# Patient Record
Sex: Male | Born: 1985 | Race: Asian | Hispanic: No | Marital: Single | Smoking: Never smoker
Health system: Southern US, Community
[De-identification: ages and names within clinical notes are randomized; demographics above are authoritative.]

## PROBLEM LIST (undated history)

## (undated) HISTORY — PX: SPLENECTOMY: SUR1306

---

## 2006-03-03 ENCOUNTER — Emergency Department (HOSPITAL_COMMUNITY): Admission: EM | Admit: 2006-03-03 | Discharge: 2006-03-04 | Payer: Self-pay | Admitting: Emergency Medicine

## 2013-02-09 ENCOUNTER — Ambulatory Visit (INDEPENDENT_AMBULATORY_CARE_PROVIDER_SITE_OTHER): Payer: BC Managed Care – PPO | Admitting: Family Medicine

## 2013-02-09 VITALS — BP 113/74 | HR 69 | Temp 98.2°F | Resp 16 | Ht 65.0 in | Wt 140.0 lb

## 2013-02-09 DIAGNOSIS — Z9081 Acquired absence of spleen: Secondary | ICD-10-CM

## 2013-02-09 DIAGNOSIS — G44309 Post-traumatic headache, unspecified, not intractable: Secondary | ICD-10-CM

## 2013-02-09 DIAGNOSIS — S0990XS Unspecified injury of head, sequela: Secondary | ICD-10-CM

## 2013-02-09 DIAGNOSIS — Z0000 Encounter for general adult medical examination without abnormal findings: Secondary | ICD-10-CM

## 2013-02-09 DIAGNOSIS — Z9089 Acquired absence of other organs: Secondary | ICD-10-CM

## 2013-02-09 DIAGNOSIS — Z23 Encounter for immunization: Secondary | ICD-10-CM

## 2013-02-09 LAB — POCT CBC
Granulocyte percent: 48.4 %G (ref 37–80)
HCT, POC: 43 % — AB (ref 43.5–53.7)
Hemoglobin: 13.1 g/dL — AB (ref 14.1–18.1)
MCH, POC: 22 pg — AB (ref 27–31.2)
MCV: 72.2 fL — AB (ref 80–97)
MID (cbc): 0.5 (ref 0–0.9)
POC Granulocyte: 2.3 (ref 2–6.9)
POC LYMPH PERCENT: 41.3 %L (ref 10–50)
RBC: 5.95 M/uL (ref 4.69–6.13)
RDW, POC: 15.5 %
WBC: 4.7 10*3/uL (ref 4.6–10.2)

## 2013-02-09 MED ORDER — AMITRIPTYLINE HCL 25 MG PO TABS: ORAL_TABLET | ORAL | Status: AC
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
## 2013-02-09 NOTE — Patient Instructions (Addendum)
Take the amitriptyline 25 mg one at bedtime for headache prevention. If this helps reduce the frequency of headaches, you can continue using it long-term. I gave you refills for 3 or 4 months, and you will need to return at the end of that time for a recheck and more prescriptions.  If the headaches do not get better, and are getting worse at anytime, return for further assessment.  Since you have had your spleen taken out, we are giving you a vaccination for prevention of meningococcal meningitis and of pneumococcal pneumonia. These are 2 types of infections that are removed from the body by the spleen, and 6 you do not have a spleen you should have the vaccinations about every 10 years.  Flu shot every year        Splenectomy, Long-Term Care After A splenectomy is the surgical removal of a diseased or injured spleen. The most common reasons for the spleen to be removed are because of severe injury (trauma), sickle cell disease, or a condition that causes blood clotting problems (idiopathic thrombocytopenic purpura, ITP). The spleen is an organ located in the upper abdomen under your left ribs. It is a sponge-like organ, about the size of an orange, which acts as a filter. The spleen removes waste from the blood, maintains cells that make antibodies to help fight infection, and stores other blood cells. The spleen, along with other body systems and organs, plays an important role in the body's natural defense system (immune system). Once it is removed, there is a slightly greater chance of developing a serious, life-threatening infection (overwhelming postsplenectomy sepsis). It is important to take extra steps to prevent infection. Other precautions may be necessary to prevent blood clots from forming. PREVENTING INFECTION Your caregiver will recommend important steps to help prevent infection. These may include:  Making sure your immunizations are up to date,  including:  Pneumococcus.  Seasonal flu (influenza).  Haemophilus influenzae type b (Hib).  Meningitis.  Making sure family members' vaccines are up to date.  In some cases, antibiotics may be needed if you get symptoms of infection. Not all patients need this type of treatment after a splenectomy. Talk to your caregiver about what is best for you if these symptoms develop.  Following good daily practices to prevent infection, such as:  Washing hands often, especially after preparing food, eating, changing diapers, and playing with children or animals.  Disinfecting surfaces regularly.  Avoiding others with active illness or infections.  Taking precautions to avoid mosquito and tick bites, such as:  Wearing proper clothing that covers the entire body when you are in wooded or marshy areas.  Changing clothing right away and checking for bites after you have been outside.  Using insect spray.  Using insect netting.  Staying indoors during peak mosquito hours. PREVENTING BLOOD CLOTS Splenectomy may increase your risk of forming a blood clot. This is of utmost concern immediately after surgery. However, it may continue as a lifelong risk. To help prevent blood clots, your caregiver may recommend:  Exercising as directed. Find a safe, regular exercise program that works well for you.  Getting up, stretching, and moving around every hour if you sit a lot or do not move about much at work or during travel time.  Taking medicines (such as aspirin) as directed. TRAVEL If you travel in the [place] take care to avoid tick bites, especially in the [place] coastal areas. Ticks can spread the parasite that causes babesiosis. Babesiosis is a flu-like  illness that is treatable. If you travel abroad where malaria is common, follow these guidelines:  Contact your caregiver and discuss the places you are visiting for specific advice.  Make sure all of your immunizations are up to  date.  Get specific immunizations to guard against the disease risk in the country you are visiting.  Understand how to prevent infections, such as malaria, abroad. These infections can pose serious risk. Precautions may include:  Daily tablets to prevent malaria.  Using mosquito nets.  Using insect spray.  Bring your broad-spectrum, full-strength antibiotics with you. HOME CARE INSTRUCTIONS   Take all medicines as directed. If you are prescribed antibiotics, discuss with your caregiver the use of a probiotic supplement to prevent stomach upset.  Keep track of medicine refills so that you do not run out of medicine.  Inform your close contacts of your condition. Consider wearing a medical alert bracelet or carrying an ID card.  See your caregiver for vaccinations, follow-up exams, and testing as directed.  Follow all your caregiver's instructions on managing this and other conditions. SEEK MEDICAL CARE IF:   You have an oral temperature above 102 F (38.9 C).  You develop signs of infection, such as chills and feeling unwell, that continue after taking the full-strength antibiotic.  You are considering travel abroad.  You have questions or concerns. SEEK IMMEDIATE MEDICAL CARE IF:   You have chest pain along with:  Shortness of breath.  Pain in the back, neck, or jaw.  You have pain or swelling in the leg.  You develop a sudden headache and dizziness. MAKE SURE YOU:   Understand these instructions.  Will watch your condition.  Will get help right away if you are not doing well or get worse. Document Released: 02/07/2010 Document Revised: 11/12/2011 Document Reviewed: 02/07/2010 [facility] Patient Information 2014 [place], [place].

## 2013-02-10 LAB — COMPREHENSIVE METABOLIC PANEL
AST: 17 U/L (ref 0–37)
Alkaline Phosphatase: 61 U/L (ref 39–117)
Calcium: 9.8 mg/dL (ref 8.4–10.5)
Chloride: 102 mEq/L (ref 96–112)
Creat: 0.87 mg/dL (ref 0.50–1.35)
Total Bilirubin: 0.6 mg/dL (ref 0.3–1.2)

## 2013-02-10 LAB — LIPID PANEL
LDL Cholesterol: 88 mg/dL (ref 0–99)
Triglycerides: 83 mg/dL (ref <150)
VLDL: 17 mg/dL (ref 0–40)

## 2013-02-12 ENCOUNTER — Encounter: Payer: Self-pay | Admitting: Family Medicine

## 2013-07-09 ENCOUNTER — Other Ambulatory Visit: Payer: Self-pay

## 2013-10-26 ENCOUNTER — Ambulatory Visit: Payer: No Typology Code available for payment source

## 2013-10-26 ENCOUNTER — Ambulatory Visit (INDEPENDENT_AMBULATORY_CARE_PROVIDER_SITE_OTHER): Payer: No Typology Code available for payment source | Admitting: Emergency Medicine

## 2013-10-26 VITALS — BP 108/80 | HR 71 | Temp 98.5°F | Resp 16 | Ht 68.0 in | Wt 140.0 lb

## 2013-10-26 DIAGNOSIS — R1013 Epigastric pain: Secondary | ICD-10-CM

## 2013-10-26 LAB — POCT URINALYSIS DIPSTICK
Bilirubin, UA: NEGATIVE
GLUCOSE UA: NEGATIVE
KETONES UA: NEGATIVE
LEUKOCYTES UA: NEGATIVE
Nitrite, UA: NEGATIVE
PH UA: 7
Protein, UA: NEGATIVE
Spec Grav, UA: 1.02
UROBILINOGEN UA: 0.2

## 2013-10-26 LAB — AMYLASE: Amylase: 55 U/L (ref 0–105)

## 2013-10-26 LAB — COMPREHENSIVE METABOLIC PANEL
ALT: 13 U/L (ref 0–53)
AST: 17 U/L (ref 0–37)
Albumin: 5.2 g/dL (ref 3.5–5.2)
Alkaline Phosphatase: 55 U/L (ref 39–117)
BUN: 19 mg/dL (ref 6–23)
CO2: 29 mEq/L (ref 19–32)
CREATININE: 1 mg/dL (ref 0.50–1.35)
Calcium: 9.5 mg/dL (ref 8.4–10.5)
Chloride: 103 mEq/L (ref 96–112)
Glucose, Bld: 85 mg/dL (ref 70–99)
POTASSIUM: 4.5 meq/L (ref 3.5–5.3)
SODIUM: 140 meq/L (ref 135–145)
TOTAL PROTEIN: 8.7 g/dL — AB (ref 6.0–8.3)
Total Bilirubin: 0.8 mg/dL (ref 0.2–1.2)

## 2013-10-26 LAB — POCT CBC
Granulocyte percent: 49.7 %G (ref 37–80)
HCT, POC: 47.5 % (ref 43.5–53.7)
HEMOGLOBIN: 14.3 g/dL (ref 14.1–18.1)
LYMPH, POC: 2.9 (ref 0.6–3.4)
MCH, POC: 22.1 pg — AB (ref 27–31.2)
MCHC: 30.1 g/dL — AB (ref 31.8–35.4)
MCV: 73.5 fL — AB (ref 80–97)
MID (CBC): 0.6 (ref 0–0.9)
MPV: 10.1 fL (ref 0–99.8)
PLATELET COUNT, POC: 301 10*3/uL (ref 142–424)
POC GRANULOCYTE: 3.4 (ref 2–6.9)
POC LYMPH PERCENT: 42.1 %L (ref 10–50)
POC MID %: 8.2 % (ref 0–12)
RBC: 6.46 M/uL — AB (ref 4.69–6.13)
RDW, POC: 15.1 %
WBC: 6.9 10*3/uL (ref 4.6–10.2)

## 2013-10-26 LAB — LIPASE: Lipase: 20 U/L (ref 0–75)

## 2013-10-26 MED ORDER — LANSOPRAZOLE 30 MG PO CPDR: 30.0000 mg | DELAYED_RELEASE_CAPSULE | Freq: Every day | ORAL | Status: DC

## 2013-10-26 MED ORDER — SUCRALFATE 1 G PO TABS: ORAL_TABLET | ORAL | Status: DC

## 2013-10-26 NOTE — Patient Instructions (Signed)
Viêm D? Dày, Ng??i L?n  (Gastritis, Adult)  Viêm d? dày là hi?n t??ng ?au nh?c và s?ng (viêm) ? l?p niêm m?c d? dày. Viêm d? dày có th? phát tri?n nh? là m?t tình tr?ng kh?i phát ??t ng?t (c?p tính) ho?c kéo dài (m?n tính). N?u viêm d? dày không ???c ?i?u tr?, b?nh có th? d?n ??n ch?y máu và loét d? dày.   NGUYÊN NHÂN  Viêm d? dày xu?t hi?n khi l?p niêm m?c d? dày y?u ho?c b? t?n th??ng. Sau ?ó, d?ch tiêu hóa t? d? dày gây viêm niêm m?c d? dày b? suy y?u. Niêm m?c d? dày có th? y?u ho?c b? t?n th??ng do nhi?m vi rút ho?c vi khu?n. M?t nhi?m trùng do vi khu?n ph? bi?n là nhi?m trùng do Helicobacter pylori. Viêm d? dày c?ng có th? do u?ng r??u quá nhi?u, dùng m?t s? lo?i thu?c nh?t ??nh ho?c có quá nhi?u axit trong d? dày.  TRI?U CH?NG  Trong m?t s? tr??ng h?p, không có tri?u ch?ng. Khi có tri?u ch?ng, chúng có th? bao g?m:  · ?au ho?c c?m giác nóng rát ? vùng b?ng trên.  · Bu?n nôn.  · Nôn.  · C?m giác khó ch?u do no sau khi ?n.  CH?N ?OÁN  Chuyên gia ch?m sóc s?c kh?e có th? nghi ng? b?n b? viêm d? dày d?a vào các tri?u ch?ng c?a b?n và khám th?c th?. ?? xác ??nh nguyên nhân viêm d? dày, chuyên gia ch?m sóc s?c kh?e có th? th?c hi?n nh? sau:  · Xét nghi?m máu ho?c phân ?? ki?m tra vi khu?n H pylori.  · Soi d? dày. M?t ?ng m?ng, m?m (?èn n?i soi) ???c lu?n xu?ng th?c qu?n và vào d? dày. ?èn n?i soi có g?n ?èn và camera ? ??u. Chuyên gia ch?m sóc s?c kh?e s? d?ng ?èn n?i soi ?? xem bên trong d? dày.  · L?y m?u mô (sinh thi?t) t? d? dày ?? ki?m tra d??i kính hi?n vi.  ?I?U TR?  Tùy thu?c vào nguyên nhân gây viêm d? dày, thu?c có th? ???c kê ??n. N?u b?n b? nhi?m trùng do vi khu?n, ch?ng h?n nh? nhi?m trùng do H pylori, kháng sinh có th? ???c cho dùng. N?u viêm d? dày là do có quá nhi?u axit trong d? dày, thu?c ch?n H2 ho?c thu?c trung hòa axit có th? ???c cho dùng. Chuyên gia ch?m sóc s?c kh?e có th? khuy?n ngh? b?n nên ng?ng dùng atpirin, ibuprofen ho?c thu?c kháng viêm không có steroid khác (NSAID).  H??NG D?N CH?M  SÓC T?I NHÀ  · Ch? s? d?ng thu?c không c?n kê toa ho?c thu?c c?n kê toa theo ch? d?n c?a chuyên gia ch?m sóc s?c kh?e.  · N?u b?n ???c cho dùng thu?c kháng sinh, hãy dùng theo ch? d?n. Dùng h?t thu?c ngay c? khi b?n b?t ??u c?m th?y ?? h?n.  · U?ng ?? n??c ?? gi? cho n??c ti?u trong ho?c vàng nh?t.  · Tránh các lo?i th?c ph?m và ?? u?ng làm cho các tri?u ch?ng t?i t? h?n, ch?ng h?n nh?:  · Caffeine ho?c ?? u?ng có c?n.  · Sô cô la.  · B?c hà ho?c v? b?c hà.  · T?i và hành tây.  · Th?c ?n cay.  · Trái cây h? cam, ch?ng h?n nh? cam, chanh hay chanh tây.  · Các th?c ?n có cà chua, ch?ng h?n nh? n??c x?t, ?t, salsa (n??c x?t cay) và bánh   L?P T?C ?I KHM N?U:  Phn c mu ?en ho?c ?? s?m.  B?n nn ra mu ho?c ch?t gi?ng nh? b?t c ph.  B?n khng th? gi? ch?t l?ng trong ng??i.  B?n b? ?au b?ng tr?m tr?ng h?n.  B?n b? s?t.  B?n khng c?m th?y kh?e h?n sau 1 tu?n.  B?n c b?t c? cu h?i ho?c th?c m?c no khc. ??M B?O B?N:  Hi?u cc h??ng d?n ny.  S? theo di tnh tr?ng c?a mnh.  S? yu c?u tr? gip ngay l?p t?c n?u b?n c?m th?y khng ?? ho?c tnh tr?ng tr?m tr?ng h?n. Document Released: 08/20/2005 Document Revised: 04/22/2013 [employer] Patient Information 2014 [employer], [place]. ?au b?ng, Ng??i l?n (Abdominal Pain, Adult) C nhi?u nguyn nhn d?n ??n ?au b?ng. Thng th??ng ?au b?ng l do m?t b?nh gy ra v s? khng ?? n?u khng ?i?u tr?Marland Kitchen. B?nh ny c th? ???c theo di v ?i?u tr? t?i nh. Chuyn gia ch?m [employer] s?c kh?e s? ti?n hnh khm th?c th? v c th? yu c?u lm xt nghi?m mu v ch?p X quang ?? xc ??nh m?c ?? nghim tr?ng c?a c?n ?au b?ng. Tuy nhin, trong nhi?u tr??ng h?p, ph?i m?t nhi?u th?i gian h?n ?? xc ??nh r nguyn nhn gy ?au b?ng. Tr??c khi tm ra nguyn nhn, chuyn gia ch?m [place] s?c kh?e c th? khng bi?t li?u qu v? c c?n lm thm xt nghi?m ho?c  ti?p t?c ?i?u tr? hay khng. H??NG D?N CH?M [place] T?I NH  Theo di c?n ?au b?ng xem c b?t k? thay ??i no khng. Nh?ng hnh ??ng sau c th? gip lo?i b? b?t c? c?m gic kh ch?u no qu v? ?ang b?.  Ch? s? d?ng thu?c khng c?n k ??n ho?c thu?c c?n k ??n theo ch? d?n c?a chuyn gia ch?m [place] s?c kh?e.  Khng dng thu?c nhu?n trng tr? khi ???c chuyn gia ch?m [place] s?c kh?e ch? ??nh.  Th? dng m?t ch? ?? ?n l?ng (n??c lu?c th?t, tr, ho?c n??c) theo ch? d?n c?a chuyn gia ch?m [place] s?c kh?e. Chuy?n d?n sang m?t ch? ?? ?n nh? n?u ch?u ???c. ?I KHM N?U:  Qu v? b? ?au b?ng khng r nguyn nhn.  Qu v? b? ?au b?ng km theo bu?n nn ho?c tiu ch?y.  Qu v? b? ?au khi ?i ti?u ho?c ?i ngoi.  Qu v? b? c?n ?au b?ng lm th?c gi?c vo ban ?m.  Qu v? b? ?au b?ng n?ng thm ho?c ?? h?n khi ?n.  Qu v? b? ?au b?ng n?ng thm khi ?n ?? ?n nhi?u ch?t bo. NGAY L?P T?C ?I KHM N?U:   C?n ?au khng kh?i trong vng 2 gi?Ladell Heads.  Qu v? b? s?t.  Qu v? v?n ti?p t?c b? i (nn m?a).  Ch? c th? c?m th?y ?au ? m?t s? ph?n b?ng, ch?ng h?n nh? ? ph?n b?ng bn ph?i ho?c bn d??i tri.  Phn c?a qu v? c mu ho?c c mu ?en nh? h?c n. ??M B?O QU V?:  Hi?u r cc h??ng d?n ny.  S? theo di tnh tr?ng c?a mnh.  S? yu c?u tr? gip ngay l?p t?c n?u qu v? c?m th?y khng kh?e ho?c th?y tr?m tr?ng h?n. Document Released: 08/20/2005 Document Revised: 06/10/2013 [facility] Patient Information 2014 [place], [place].

## 2013-10-26 NOTE — Progress Notes (Addendum)
[facility] [address], [place] [id number] [phone]  Date:  10/26/2013   Name:  Vernon Foley   DOB:  [birth year]   MRN:  [id number]  PCP:  No PCP Per Patient    Chief Complaint: Abdominal Pain   History of Present Illness:  Vernon Foley is a 28 y.o. very pleasant male patient who presents with the following:  Ill since last week with epigastric pain.  No nausea or vomiting  No stool change. No blood in stool.  Denies heartburn.  Pain is relieved by eating a small amount of food.  Worse when hungry.  No food intolerance.  No excess alcohol, caffeine, ASA, NSAID.  Non smoker non drinker.  No history of PUD.  No improvement with over the counter medications or other home remedies. Denies other complaint or health concern today.   There are no active problems to display for this patient.   History reviewed. No pertinent past medical history.  History reviewed. No pertinent past surgical history.  History  Substance Use Topics  . Smoking status: Never Smoker   . Smokeless tobacco: Not on file  . Alcohol Use: Yes    History reviewed. No pertinent family history.  No Known Allergies  Medication list has been reviewed and updated.  Current Outpatient Prescriptions on File Prior to Visit  Medication Sig Dispense Refill  . amitriptyline (ELAVIL) 25 MG tablet Take one tablet at bedtime for prevention of headaches  30 tablet  3   No current facility-administered medications on file prior to visit.    Review of Systems:  As per HPI, otherwise negative.    Physical Examination: Filed Vitals:   10/26/13 1447  BP: 108/80  Pulse: 71  Temp: 98.5 F (36.9 C)  Resp: 16   Filed Vitals:   10/26/13 1447  Height: 5\' 8"  (1.727 m)  Weight: 140 lb (63.504 kg)   Body mass index is 21.29 kg/(m^2). Ideal Body Weight: Weight in (lb) to have BMI = 25: 164.1  GEN: WDWN, NAD, Non-toxic, A & O x 3 HEENT: Atraumatic, Normocephalic. Neck supple. No masses,  No LAD. Ears and Nose: No external deformity. CV: RRR, No M/G/R. No JVD. No thrill. No extra heart sounds. PULM: CTA B, no wheezes, crackles, rhonchi. No retractions. No resp. distress. No accessory muscle use. ABD: S, ND, +BS. No rebound. No HSM.  Midline surgical scar  Epigastric tenderness EXTR: No c/c/e NEURO Normal gait.  PSYCH: Normally interactive. Conversant. Not depressed or anxious appearing.  Calm demeanor.    Assessment and Plan: Abdominal pain Gastritis Prevacid carafate Await labs    Signed,  Phillips OdorJeffery Marietta Sikkema, MD   Results for orders placed in visit on 10/26/13  POCT CBC      Result Value Ref Range   WBC 6.9  4.6 - 10.2 K/uL   Lymph, poc 2.9  0.6 - 3.4   POC LYMPH PERCENT 42.1  10 - 50 %L   MID (cbc) 0.6  0 - 0.9   POC MID % 8.2  0 - 12 %M   POC Granulocyte 3.4  2 - 6.9   Granulocyte percent 49.7  37 - 80 %G   RBC 6.46 (*) 4.69 - 6.13 M/uL   Hemoglobin 14.3  14.1 - 18.1 g/dL   HCT, POC 29.547.5  28.443.5 - 53.7 %   MCV 73.5 (*) 80 - 97 fL   MCH, POC 22.1 (*) 27 - 31.2 pg   MCHC 30.1 (*) 31.8 -  35.4 g/dL   RDW, POC 16.1     Platelet Count, POC 301  142 - 424 K/uL   MPV 10.1  0 - 99.8 fL  POCT URINALYSIS DIPSTICK      Result Value Ref Range   Color, UA yellow     Clarity, UA clear     Glucose, UA neg     Bilirubin, UA neg     Ketones, UA neg     Spec Grav, UA 1.020     Blood, UA trace-intact     pH, UA 7.0     Protein, UA neg     Urobilinogen, UA 0.2     Nitrite, UA neg     Leukocytes, UA Negative     [facility] reading (PRIMARY) by  Dr. Dareen Piano.  negative.

## 2013-10-27 ENCOUNTER — Other Ambulatory Visit: Payer: Self-pay | Admitting: Emergency Medicine

## 2013-10-27 LAB — H. PYLORI ANTIBODY, IGG: H PYLORI IGG: 3.7 {ISR} — AB

## 2013-10-27 MED ORDER — AMOXICILL-CLARITHRO-LANSOPRAZ PO MISC: ORAL | Status: DC

## 2013-12-31 ENCOUNTER — Ambulatory Visit (INDEPENDENT_AMBULATORY_CARE_PROVIDER_SITE_OTHER): Payer: No Typology Code available for payment source | Admitting: Emergency Medicine

## 2013-12-31 ENCOUNTER — Ambulatory Visit: Payer: No Typology Code available for payment source

## 2013-12-31 VITALS — BP 112/82 | HR 72 | Temp 98.3°F | Resp 16 | Ht 67.5 in | Wt 141.2 lb

## 2013-12-31 DIAGNOSIS — K5900 Constipation, unspecified: Secondary | ICD-10-CM

## 2013-12-31 DIAGNOSIS — R112 Nausea with vomiting, unspecified: Secondary | ICD-10-CM

## 2013-12-31 LAB — LIPASE: Lipase: 29 U/L (ref 0–75)

## 2013-12-31 LAB — COMPREHENSIVE METABOLIC PANEL
ALBUMIN: 4.6 g/dL (ref 3.5–5.2)
ALT: 13 U/L (ref 0–53)
AST: 15 U/L (ref 0–37)
Alkaline Phosphatase: 63 U/L (ref 39–117)
BILIRUBIN TOTAL: 0.8 mg/dL (ref 0.2–1.2)
BUN: 14 mg/dL (ref 6–23)
CHLORIDE: 102 meq/L (ref 96–112)
CO2: 28 meq/L (ref 19–32)
CREATININE: 0.95 mg/dL (ref 0.50–1.35)
Calcium: 9.5 mg/dL (ref 8.4–10.5)
Glucose, Bld: 86 mg/dL (ref 70–99)
POTASSIUM: 4.6 meq/L (ref 3.5–5.3)
SODIUM: 139 meq/L (ref 135–145)
Total Protein: 8 g/dL (ref 6.0–8.3)

## 2013-12-31 LAB — POCT URINALYSIS DIPSTICK
BILIRUBIN UA: NEGATIVE
Blood, UA: NEGATIVE
GLUCOSE UA: NEGATIVE
KETONES UA: NEGATIVE
Leukocytes, UA: NEGATIVE
NITRITE UA: NEGATIVE
Protein, UA: NEGATIVE
SPEC GRAV UA: 1.02
UROBILINOGEN UA: 0.2
pH, UA: 7.5

## 2013-12-31 LAB — POCT CBC
GRANULOCYTE PERCENT: 47.7 % (ref 37–80)
HCT, POC: 45.6 % (ref 43.5–53.7)
Hemoglobin: 14.3 g/dL (ref 14.1–18.1)
Lymph, poc: 2.6 (ref 0.6–3.4)
MCH, POC: 22.3 pg — AB (ref 27–31.2)
MCHC: 31.4 g/dL — AB (ref 31.8–35.4)
MCV: 71 fL — AB (ref 80–97)
MID (cbc): 0.5 (ref 0–0.9)
MPV: 9.2 fL (ref 0–99.8)
POC Granulocyte: 2.9 (ref 2–6.9)
POC LYMPH %: 44.1 % (ref 10–50)
POC MID %: 8.2 %M (ref 0–12)
Platelet Count, POC: 335 10*3/uL (ref 142–424)
RBC: 6.42 M/uL — AB (ref 4.69–6.13)
RDW, POC: 314.8 %
WBC: 6 10*3/uL (ref 4.6–10.2)

## 2013-12-31 LAB — AMYLASE: Amylase: 60 U/L (ref 0–105)

## 2013-12-31 MED ORDER — POLYETHYLENE GLYCOL 3350 17 GM/SCOOP PO POWD: 17.0000 g | Freq: Every day | ORAL | Status: DC

## 2013-12-31 MED ORDER — SUCRALFATE 1 G PO TABS: ORAL_TABLET | ORAL | Status: DC

## 2013-12-31 MED ORDER — LANSOPRAZOLE 30 MG PO CPDR: 30.0000 mg | DELAYED_RELEASE_CAPSULE | Freq: Every day | ORAL | Status: DC

## 2013-12-31 NOTE — Progress Notes (Signed)
[facility] [address], [place] [address] [phone]  Date:  12/31/2013   Name:  Vernon Foley   DOB:  August 29, 1986   MRN:  [id number]  PCP:  No PCP Per Patient    Chief Complaint: Emesis and Abdominal Pain   History of Present Illness:  Vernon Foley is a 28 y.o. very pleasant male patient who presents with the following:  Ill with abdominal pain and nausea and vomiting.  Seen a week ago and treated for positive Hpylori.  No stool change.  Has lost four pounds.  Concerned that he has persistent nausea and vomiting.  No stool change.  No fever or chills.  Eating one meal a day.  Seen in ER in [place] last night and had no clear diagnosis made.  No improvement with over the counter medications or other home remedies. Denies other complaint or health concern today.   There are no active problems to display for this patient.   History reviewed. No pertinent past medical history.  History reviewed. No pertinent past surgical history.  History  Substance Use Topics  . Smoking status: Never Smoker   . Smokeless tobacco: Not on file  . Alcohol Use: Yes    History reviewed. No pertinent family history.  No Known Allergies  Medication list has been reviewed and updated.  Current Outpatient Prescriptions on File Prior to Visit  Medication Sig Dispense Refill  . lansoprazole (PREVACID) 30 MG capsule Take 1 capsule (30 mg total) by mouth daily at 12 noon.  30 capsule  2  . sucralfate (CARAFATE) 1 G tablet 1 tab 1 hr ac and hs  120 tablet  0  . amitriptyline (ELAVIL) 25 MG tablet Take one tablet at bedtime for prevention of headaches  30 tablet  3  . amoxicillin-clarithromycin-lansoprazole (PREVPAC) combo pack Follow package directions.  Please issue prescriptions as generic components to save money  1 kit  0   No current facility-administered medications on file prior to visit.    Review of Systems:  As per HPI, otherwise negative.    Physical  Examination: Filed Vitals:   12/31/13 1433  BP: 112/82  Pulse: 72  Temp: 98.3 F (36.8 C)  Resp: 16   Filed Vitals:   12/31/13 1433  Height: 5' 7.5" (1.715 m)  Weight: 141 lb 3.2 oz (64.048 kg)   Body mass index is 21.78 kg/(m^2). Ideal Body Weight: Weight in (lb) to have BMI = 25: 161.7  GEN: WDWN, NAD, Non-toxic, A & O x 3 HEENT: Atraumatic, Normocephalic. Neck supple. No masses, No LAD. Ears and Nose: No external deformity. CV: RRR, No M/G/R. No JVD. No thrill. No extra heart sounds. PULM: CTA B, no wheezes, crackles, rhonchi. No retractions. No resp. distress. No accessory muscle use. ABD: S, NT, ND, +BS. No rebound. No HSM. EXTR: No c/c/e NEURO Normal gait.  PSYCH: Normally interactive. Conversant. Not depressed or anxious appearing.  Calm demeanor.    Assessment and Plan: Abdominal pain and vomiting Constipation miralax  sono  Repeat labs   Signed,  Ellison Carwin, MD   Results for orders placed in visit on 12/31/13  POCT CBC      Result Value Ref Range   WBC 6.0  4.6 - 10.2 K/uL   Lymph, poc 2.6  0.6 - 3.4   POC LYMPH PERCENT 44.1  10 - 50 %L   MID (cbc) 0.5  0 - 0.9   POC MID % 8.2  0 -  12 %M   POC Granulocyte 2.9  2 - 6.9   Granulocyte percent 47.7  37 - 80 %G   RBC 6.42 (*) 4.69 - 6.13 M/uL   Hemoglobin 14.3  14.1 - 18.1 g/dL   HCT, POC 45.6  43.5 - 53.7 %   MCV 71.0 (*) 80 - 97 fL   MCH, POC 22.3 (*) 27 - 31.2 pg   MCHC 31.4 (*) 31.8 - 35.4 g/dL   RDW, POC 314.8     Platelet Count, POC 335  142 - 424 K/uL   MPV 9.2  0 - 99.8 fL  POCT URINALYSIS DIPSTICK      Result Value Ref Range   Color, UA yellow     Clarity, UA clear     Glucose, UA neg     Bilirubin, UA neg     Ketones, UA neg     Spec Grav, UA 1.020     Blood, UA neg     pH, UA 7.5     Protein, UA neg     Urobilinogen, UA 0.2     Nitrite, UA neg     Leukocytes, UA Negative     [facility] reading (PRIMARY) by  Dr. Ouida Sills.  Increased stool.  No SBO or free air .

## 2013-12-31 NOTE — Patient Instructions (Signed)
°Vernon Foley, Ng??i L?n °(Constipation, Adult) °Vernon Foley là khi m?t ng??i ?i ngoài ít h?n 3 l?n m?t tu?n; g?p khó kh?n trong khi ?i ngoài ho?c có phân khô, c?ng ho?c l?n h?n bình th??ng. Khi chúng ta già ?i, Vernon Foley ph? bi?n h?n. N?u b?n tìm cách ch?a Vernon Foley b?ng thu?c giúp b?n ?i ngoài (thu?c nhu?n tràng), v?n ?? có th? tr? nên t?i t? h?n. S? d?ng thu?c nhu?n tràng lâu dài có th? khi?n cho các c? ru?t già tr? nên y?u ?i. M?t ch? ?? ?n ít ch?t x?, không u?ng ?? n??c và vi?c dùng m?t s? lo?i thu?c nh?t ??nh có th? khi?n cho Vernon Foley t?i t? h?n. °NGUYÊN NHÂN °· M?t s? lo?i thu?c nh?t ??nh, ch?ng h?n nh? thu?c ch?ng tr?m c?m, thu?c gi?m ?au, b? sung ch?t s?t, thu?c kháng axit và thu?c l?i ti?u. °· M?t s? b?nh nh?t ??nh nh? ti?u ???ng, h?i ch?ng ru?t kích thích (IBS), b?nh tuy?n giáp ho?c tr?m c?m. °· Không u?ng ?? n??c. °· Không ?n ?? th?c ph?m giàu ch?t x?. °· C?ng th?ng ho?c ?i l?i. °· Thi?u ho?t ??ng th? ch?t ho?c t?p th? d?c. °· Không ?i v? sinh khi có nhu c?u ?i ngoài. °· B? qua nhu c?u ?i ngoài. °· S? d?ng thu?c nhu?n tràng quá nhi?u. °TRI?U CH?NG °· ?i ngoài ít h?n 3 l?n m?t tu?n. °· R?n ?? ??i ti?n. °· Có phân c?ng, khô ho?c l?n h?n bình th??ng. °· C?m th?y ??y b?ng ho?c ch??ng b?ng. °· ?au ? vùng b?ng d??i. °· Không c?m th?y nh? nhõm sau khi ?i ngoài. °CH?N ?OÁN °Chuyên gia ch?m sóc s?c kh?e s? xem b?nh s? c?a b?n và khám th?c th?. Ki?m tra thêm có th? ???c th?c hi?n v?i ch?ng Vernon Foley n?ng. M?t s? ki?m tra có th? bao g?m: °· X-quang có th?t bari ?? khám tr?c tràng, ??i tràng và ?ôi khi c? ru?t non c?a b?n. °· N?i soi ??i tràng sigma ?? khám ??i tràng phía d??i c?a b?n. °· N?i soi ??i tràng ?? khám toàn b? ??i tràng c?a b?n. °?I?U TR? °?i?u tr? s? ph? thu?c vào m?c ?? nghiêm tr?ng c?a Vernon Foley và nh?ng gì nó gây ra. M?t s? ph??ng pháp ?i?u tr? b?ng ?n kiêng bao g?m u?ng thêm nhi?u n??c h?n và ?n nhi?u th?c ph?m giàu ch?t x? h?n. Ph??ng pháp ?i?u tr? theo l?i s?ng có th? bao g?m t?p th? d?c th??ng xuyên. N?u các ??  ngh? v? ch? ?? ?n u?ng và l?i s?ng này không có tác d?ng, chuyên gia ch?m sóc s?c kh?e c?a b?n có th? khuyên b?n nên dùng thu?c nhu?n tràng không c?n kê toa ?? giúp b?n ?i ngoài. Thu?c c?n kê toa có th? ???c kê ??n n?u thu?c không c?n kê toa không có tác d?ng. °H??NG D?N CH?M SÓC T?I NHÀ °· T?ng ch?t x? ?n kiêng trong ch? ?? ?n u?ng c?a b?n, ch?ng h?n nh? trái cây, rau, ng? c?c và ??u. H?n ch? các lo?i th?c ph?m có hàm l??ng ch?t béo cao và ???ng ?ã qua x? lý trong ch? ?? ?n u?ng c?a b?n, ch?ng h?n nh? khoai tây chiên, hamburger, bánh quy, k?o và soda. °· M?t ch?t b? sung ch?t x? có th? ???c thêm vào ch? ?? ?n u?ng c?a b?n n?u b?n không th? nh?n ?? ch?t x? t? các lo?i th?c ph?m. °· U?ng ?? n??c ?? gi? cho n??c ti?u trong ho?c vàng nh?t. °· T?p th?   d?c th??ng xuyên ho?c theo ch? d?n c?a chuyên gia ch?m sóc s?c kh?e. °· ?i v? sinh khi b?n có nhu c?u. Không c? nh?n. °· Ch? s? d?ng thu?c theo ch? d?n c?a chuyên gia ch?m sóc s?c kh?e. Không dùng các lo?i thu?c khác ?? tr? Vernon Foley mà không nói chuy?n tr??c v?i chuyên gia ch?m sóc s?c kh?e. °HÃY NGAY L?P T?C ?I KHÁM N?U: °· Phân c?a b?n có màu ?? t??i. °· Vernon Foley kéo dài h?n 4 ngày ho?c b?n b? n?ng h?n. °· B?n b? ?au b?ng ho?c ?au tr?c tràng. °· Phân c?a b?n m?ng, gi?ng nh? bút chì. °· B?n b? gi?m cân không gi?i thích ???c. °??M B?O B?N: °· Hi?u các h??ng d?n này. °· S? theo dõi tình tr?ng c?a mình. °· S? yêu c?u tr? giúp ngay l?p t?c n?u b?n c?m th?y không ?? ho?c tình tr?ng tr?m tr?ng h?n. °Document Released: 12/05/2010 Document Revised: 04/22/2013 °[employer]® Patient Information ©2014 [employer]. ° ° °

## 2014-01-07 ENCOUNTER — Telehealth: Payer: Self-pay

## 2014-01-07 NOTE — Telephone Encounter (Signed)
Dr Dareen PianoAnderson, a prior auth is needed for lansoprazole. Can we change to omeprazole and see if it will be covered?

## 2014-01-07 NOTE — Telephone Encounter (Signed)
That would be fine 

## 2014-01-08 MED ORDER — OMEPRAZOLE 40 MG PO CPDR: 40.0000 mg | DELAYED_RELEASE_CAPSULE | Freq: Every day | ORAL | Status: DC

## 2014-01-08 NOTE — Telephone Encounter (Signed)
Sent Rx for omeprazole. LM for pharmacist that this replaces Rx for lansoprazole.

## 2014-01-11 ENCOUNTER — Ambulatory Visit
Admission: RE | Admit: 2014-01-11 | Discharge: 2014-01-11 | Disposition: A | Discharge status: Home / Self Care | Payer: Self-pay | Source: Ambulatory Visit | Attending: Emergency Medicine | Admitting: Emergency Medicine

## 2014-01-11 DIAGNOSIS — R112 Nausea with vomiting, unspecified: Secondary | ICD-10-CM

## 2014-01-12 ENCOUNTER — Other Ambulatory Visit: Payer: Self-pay | Admitting: Emergency Medicine

## 2014-01-12 DIAGNOSIS — R1011 Right upper quadrant pain: Secondary | ICD-10-CM

## 2014-01-26 ENCOUNTER — Ambulatory Visit (HOSPITAL_COMMUNITY): Payer: No Typology Code available for payment source

## 2014-02-15 ENCOUNTER — Ambulatory Visit (HOSPITAL_COMMUNITY)
Admission: RE | Admit: 2014-02-15 | Discharge: 2014-02-15 | Disposition: A | Discharge status: Home / Self Care | Payer: No Typology Code available for payment source | Source: Ambulatory Visit | Attending: Emergency Medicine | Admitting: Emergency Medicine

## 2014-02-15 DIAGNOSIS — R1011 Right upper quadrant pain: Secondary | ICD-10-CM | POA: Insufficient documentation

## 2014-02-15 MED ORDER — SINCALIDE 5 MCG IJ SOLR
0.0200 ug/kg | Freq: Once | INTRAMUSCULAR | Status: AC
  Administered 2014-02-15: 1.3 ug via INTRAVENOUS

## 2014-02-15 MED ORDER — TECHNETIUM TC 99M MEBROFENIN IV KIT
5.4000 | PACK | Freq: Once | INTRAVENOUS | Status: AC | PRN
  Administered 2014-02-15: 5 via INTRAVENOUS

## 2014-05-03 ENCOUNTER — Ambulatory Visit (INDEPENDENT_AMBULATORY_CARE_PROVIDER_SITE_OTHER): Payer: Self-pay | Admitting: Emergency Medicine

## 2014-05-03 VITALS — BP 116/72 | HR 65 | Temp 97.8°F | Resp 16 | Ht 67.5 in | Wt 136.6 lb

## 2014-05-03 DIAGNOSIS — Z0289 Encounter for other administrative examinations: Secondary | ICD-10-CM

## 2014-05-03 NOTE — Progress Notes (Signed)
[facility] [address], [place] [address] [phone]  Date:  05/03/2014   Name:  Vernon Foley   DOB:  1985-11-05   MRN:  [id number]  PCP:  No PCP Per Patient    Chief Complaint: DOT   History of Present Illness:  Vernon Foley is a 28 y.o. very pleasant male patient who presents with the following:  DOT   There are no active problems to display for this patient.   History reviewed. No pertinent past medical history.  History reviewed. No pertinent past surgical history.  History  Substance Use Topics  . Smoking status: Never Smoker   . Smokeless tobacco: Not on file  . Alcohol Use: Yes    History reviewed. No pertinent family history.  No Known Allergies  Medication list has been reviewed and updated.  Current Outpatient Prescriptions on File Prior to Visit  Medication Sig Dispense Refill  . amitriptyline (ELAVIL) 25 MG tablet Take one tablet at bedtime for prevention of headaches  30 tablet  3   No current facility-administered medications on file prior to visit.    Review of Systems:  As per HPI, otherwise negative.    Physical Examination: Filed Vitals:   05/03/14 1410  BP: 116/72  Pulse: 65  Temp: 97.8 F (36.6 C)  Resp: 16   Filed Vitals:   05/03/14 1410  Height: 5' 7.5" (1.715 m)  Weight: 136 lb 9.6 oz (61.961 kg)   Body mass index is 21.07 kg/(m^2). Ideal Body Weight: Weight in (lb) to have BMI = 25: 161.7  GEN: WDWN, NAD, Non-toxic, A & O x 3 HEENT: Atraumatic, Normocephalic. Neck supple. No masses, No LAD. Ears and Nose: No external deformity. CV: RRR, No M/G/R. No JVD. No thrill. No extra heart sounds. PULM: CTA B, no wheezes, crackles, rhonchi. No retractions. No resp. distress. No accessory muscle use. ABD: S, NT, ND, +BS. No rebound. No HSM. EXTR: No c/c/e NEURO Normal gait.  PSYCH: Normally interactive. Conversant. Not depressed or anxious appearing.  Calm demeanor.    Assessment and  Plan: DOT  Signed,  Phillips Odor, MD

## 2014-08-15 ENCOUNTER — Other Ambulatory Visit: Payer: Self-pay | Admitting: Internal Medicine

## 2014-08-15 ENCOUNTER — Ambulatory Visit (INDEPENDENT_AMBULATORY_CARE_PROVIDER_SITE_OTHER): Payer: No Typology Code available for payment source | Admitting: Internal Medicine

## 2014-08-15 VITALS — BP 116/72 | HR 75 | Temp 98.0°F | Resp 18 | Ht 68.0 in | Wt 135.0 lb

## 2014-08-15 DIAGNOSIS — G478 Other sleep disorders: Secondary | ICD-10-CM

## 2014-08-15 DIAGNOSIS — R634 Abnormal weight loss: Secondary | ICD-10-CM

## 2014-08-15 DIAGNOSIS — L700 Acne vulgaris: Secondary | ICD-10-CM

## 2014-08-15 DIAGNOSIS — R718 Other abnormality of red blood cells: Secondary | ICD-10-CM

## 2014-08-15 LAB — POCT CBC
GRANULOCYTE PERCENT: 37.6 % (ref 37–80)
HCT, POC: 46.3 % (ref 43.5–53.7)
HEMOGLOBIN: 14 g/dL — AB (ref 14.1–18.1)
Lymph, poc: 2.9 (ref 0.6–3.4)
MCH: 21.2 pg — AB (ref 27–31.2)
MCHC: 30.3 g/dL — AB (ref 31.8–35.4)
MCV: 69.8 fL — AB (ref 80–97)
MID (cbc): 0.4 (ref 0–0.9)
MPV: 7.7 fL (ref 0–99.8)
POC GRANULOCYTE: 2 (ref 2–6.9)
POC LYMPH %: 55.5 % — AB (ref 10–50)
POC MID %: 6.9 %M (ref 0–12)
Platelet Count, POC: 277 10*3/uL (ref 142–424)
RBC: 6.64 M/uL — AB (ref 4.69–6.13)
RDW, POC: 15.5 %
WBC: 5.3 10*3/uL (ref 4.6–10.2)

## 2014-08-15 MED ORDER — DOXYCYCLINE HYCLATE 100 MG PO TABS: 100.0000 mg | ORAL_TABLET | Freq: Every day | ORAL | Status: AC

## 2014-08-15 NOTE — Progress Notes (Signed)
Subjective:    Patient ID: Vernon Foley, male    DOB: 28-Nov-1985, 28 y.o.   MRN: [id number]  HPI Vernon Foley is a previously healthy 28yo male presenting with 3 mos of acne and weight loss. He states that he did not have acne in his teens, but has had increasing facial and back lesions over the past 3 mos.  A friend gave him their topical benzoyl peroxide-clindamycin to try.  He has been using this once daily for the past few weeks and stopped it a few days ago because it was not helping.  He has not had any recent changes in physical environment, but does endorse significant stress at school and work.  He washes his face with soap and water 3x a day.  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  He is not sleeping well as he wakes up at night worrying about work.  He states he has less time to do activities he used to enjoy because of work and school.  He denies feeling depressed and anhedonia.    He is also concerned by 10lb wt loss over the past few mos and decreased appetite (now eating 2 meals a day instead of 3-4).  He denies night sweats, fever, abd pain, n/v, diarrhea, chest pain and ShOB.  He is sexually active with one male partner, dose not always use condoms, and was last tested for STDs last yr and negative   Review of Systems  Constitutional: Positive for unexpected weight change. Negative for fever and chills.  HENT: Negative for congestion and sore throat.   Respiratory: Negative for cough and shortness of breath.   Cardiovascular: Positive for palpitations. Negative for chest pain.  Gastrointestinal: Negative for nausea, vomiting, abdominal pain and diarrhea.  Skin: Negative for rash.       Acne  Neurological: Positive for dizziness and weakness. Negative for headaches.  Hematological: Does not bruise/bleed easily.  Psychiatric/Behavioral: Negative  for confusion.      Objective:   Filed Vitals:   08/15/14 1338  BP: 116/72  Pulse: 75  Temp: 98 F (36.7 C)  Resp: 18    Physical Exam  Constitutional: He is oriented to person, place, and time. He appears well-developed and well-nourished. No distress.  HENT:  Head: Normocephalic and atraumatic.  Right Ear: External ear normal.  Left Ear: External ear normal.  Nose: Nose normal.  Mouth/Throat: Oropharynx is clear and moist. No oropharyngeal exudate.  Eyes: Conjunctivae and EOM are normal. Pupils are equal, round, and reactive to light.  Neck: Normal range of motion. Neck supple.  Cardiovascular: Normal rate and regular rhythm.   No murmur heard. Pulmonary/Chest: Effort normal and breath sounds normal. No respiratory distress. He has no wheezes. He has no rales.  Abdominal: Soft. Bowel sounds are normal. There is no tenderness.  Musculoskeletal: Normal range of motion.  Lymphadenopathy:    He has no cervical adenopathy.  Neurological: He is alert and oriented to person, place, and time.  Skin: Skin is warm and dry.  Scattered comedones on forehead, cheeks and upper back.  No cystic or nodular lesions  Psychiatric: He has a normal mood and affect. His behavior is normal. Thought content normal.      Assessment & Plan:   1. Acne vulgaris -Scattered facial and back lesions -Not clear why patient is having new  onset ance now, may be in part due to increased stress -Discussed hygiene, washing face once daily with soap -Given lesions on face and back and no improvement on topical clindamycin and benzoyl peroxide, Rx po doxycycline -Return to clinic in 1 mo for f/u  2. Weight loss -10 lb wt loss in 2-3 mos with decreased appetite -CBC with microcytosis, otherwise wnl -CMP and TSH sent and pending -If lab w/u wnl, likely due to stress  3. Stress, poor sleep -PHQ-2 of 0.  Pt does endorse anxiety, poor sleep and current wt loss and recent past GI sx and HAs could all be 2/2  stress -Significant pressure at work and starting to do fewer things he used to enjoy because he is spending more time working -Discussed the need for doing pleasurable activities outside of work for improved overall health -No medications Rx at this time but will readdress at next visit  4. Microcytosis -Noted on CBC, MCV 68.9 -May have thalassemia minor, no anemia and not likely contributing to sx, hold on further w/u at this time   Dr Brandon MelnickSwerlick assisted me with this patient RPD

## 2014-08-16 LAB — COMPREHENSIVE METABOLIC PANEL
ALBUMIN: 4.9 g/dL (ref 3.5–5.2)
ALK PHOS: 58 U/L (ref 39–117)
ALT: 14 U/L (ref 0–53)
AST: 17 U/L (ref 0–37)
BUN: 11 mg/dL (ref 6–23)
CALCIUM: 10.1 mg/dL (ref 8.4–10.5)
CHLORIDE: 102 meq/L (ref 96–112)
CO2: 28 mEq/L (ref 19–32)
CREATININE: 0.84 mg/dL (ref 0.50–1.35)
Glucose, Bld: 94 mg/dL (ref 70–99)
Potassium: 4.6 mEq/L (ref 3.5–5.3)
SODIUM: 138 meq/L (ref 135–145)
TOTAL PROTEIN: 8.4 g/dL — AB (ref 6.0–8.3)
Total Bilirubin: 1 mg/dL (ref 0.2–1.2)

## 2014-08-16 LAB — TSH: TSH: 2.62 u[IU]/mL (ref 0.350–4.500)

## 2014-08-19 LAB — PROTEIN ELECTROPHORESIS, SERUM
ALBUMIN ELP: 57.1 % (ref 55.8–66.1)
ALPHA-1-GLOBULIN: 3.1 % (ref 2.9–4.9)
Alpha-2-Globulin: 8.2 % (ref 7.1–11.8)
BETA 2: 5.6 % (ref 3.2–6.5)
Beta Globulin: 6.3 % (ref 4.7–7.2)
GAMMA GLOBULIN: 19.7 % — AB (ref 11.1–18.8)
TOTAL PROTEIN, SERUM ELECTROPHOR: 8.4 g/dL — AB (ref 6.0–8.3)

## 2015-05-13 ENCOUNTER — Other Ambulatory Visit: Payer: Self-pay | Admitting: Internal Medicine

## 2016-03-25 IMAGING — US US ABDOMEN COMPLETE
1 series · 14 of 25 positions shown · non-contrast
Comparison: One thousand fifteen

CLINICAL DATA: Abdominal pain, persistent vomiting abdomen films of
430

EXAM:
ULTRASOUND ABDOMEN COMPLETE

[Series 1: us abdomen complete · 0.20mm/px · 14 of 84 slices shown]
[im 1/84]
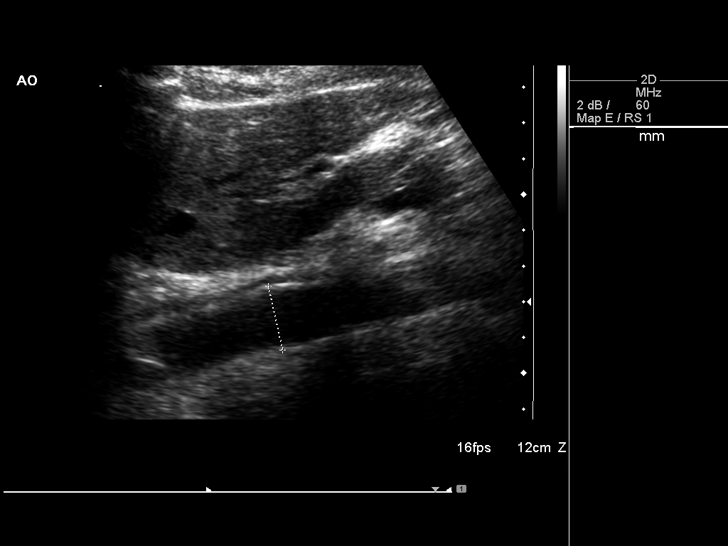
[im 7/84]
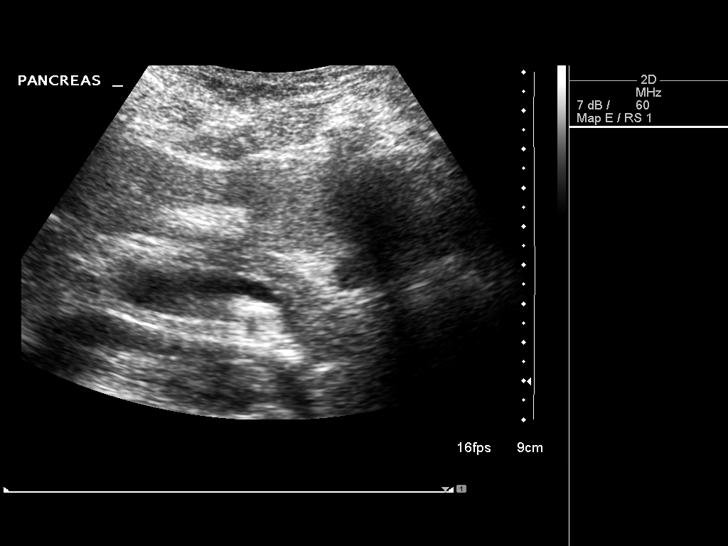
[im 14/84]
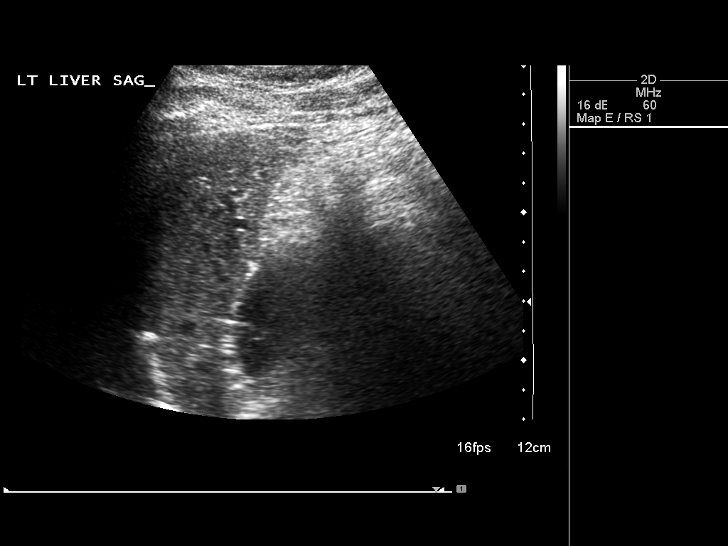
[im 21/84]
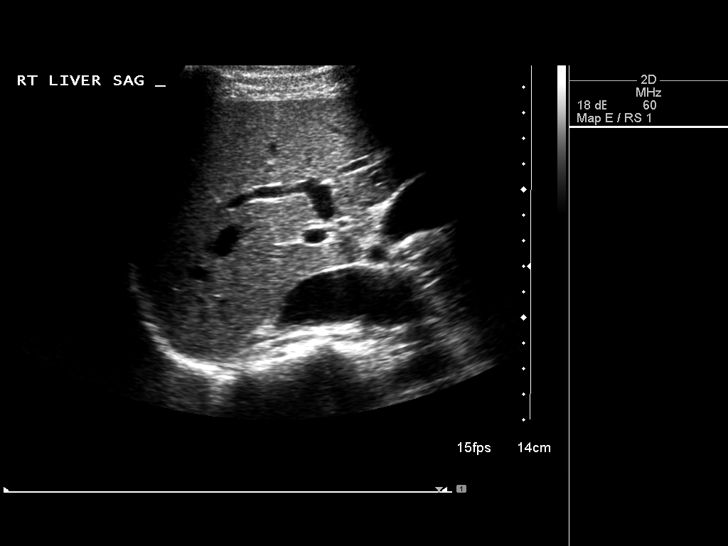
[im 28/84]
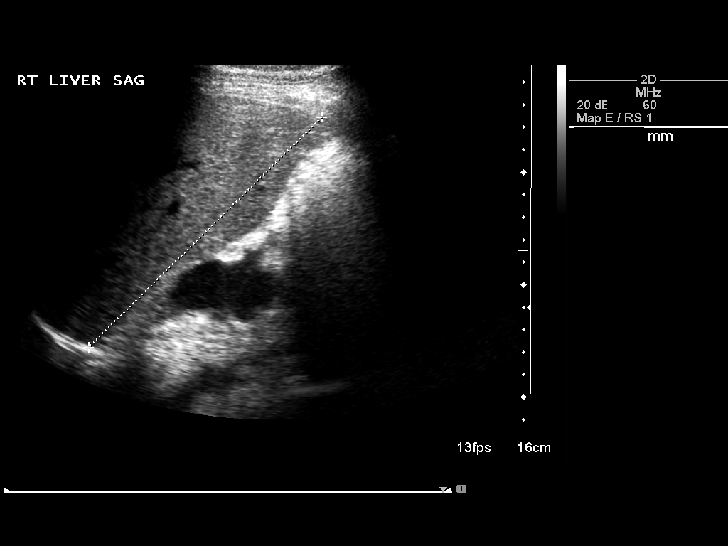
[im 32/84]
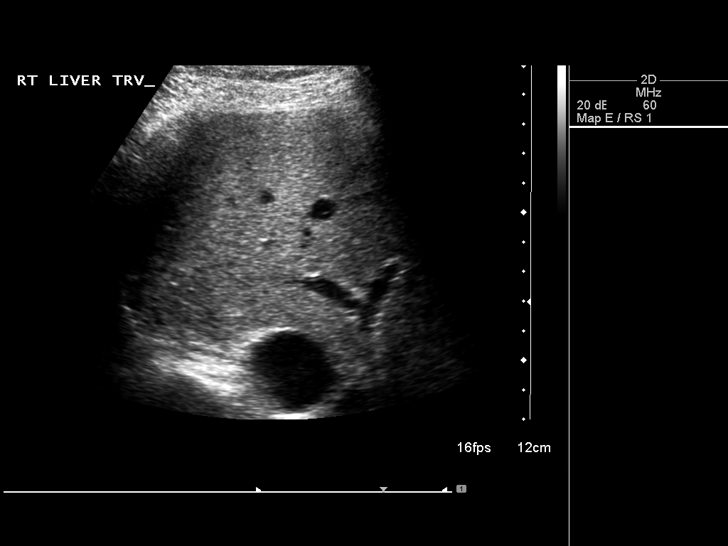
[im 39/84]
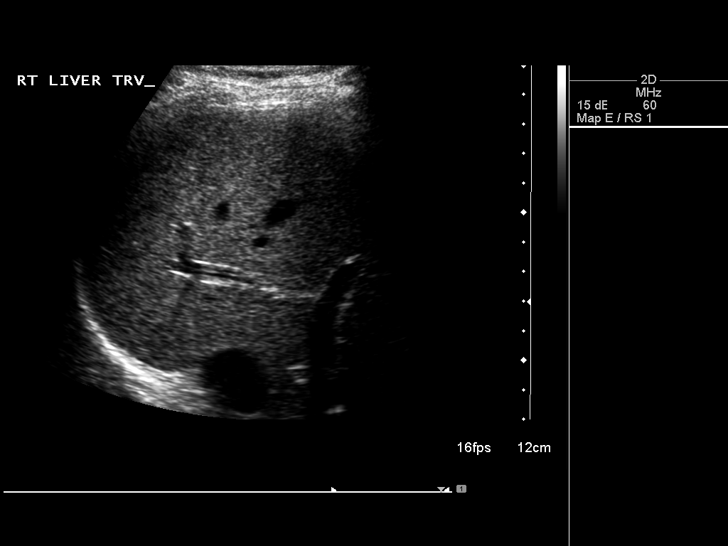
[im 45/84]
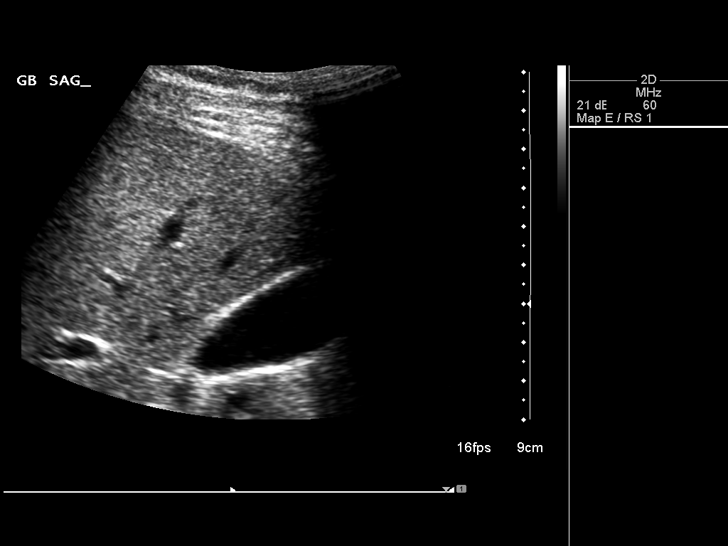
[im 52/84]
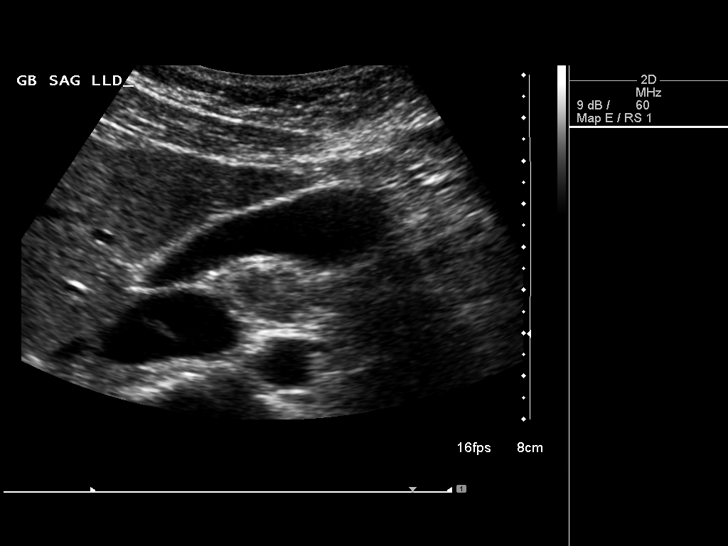
[im 56/84]
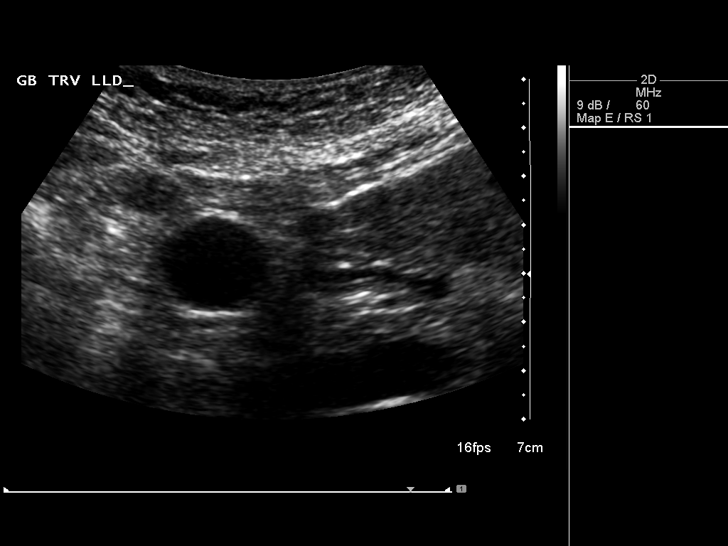
[im 63/84]
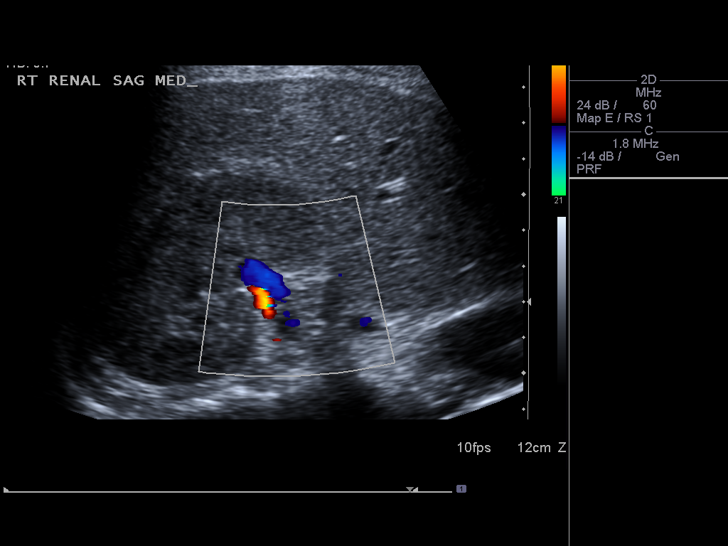
[im 70/84]
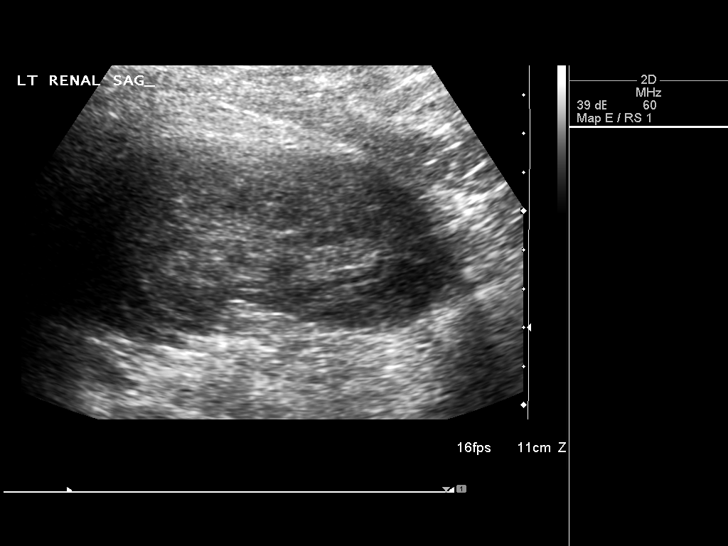
[im 77/84]
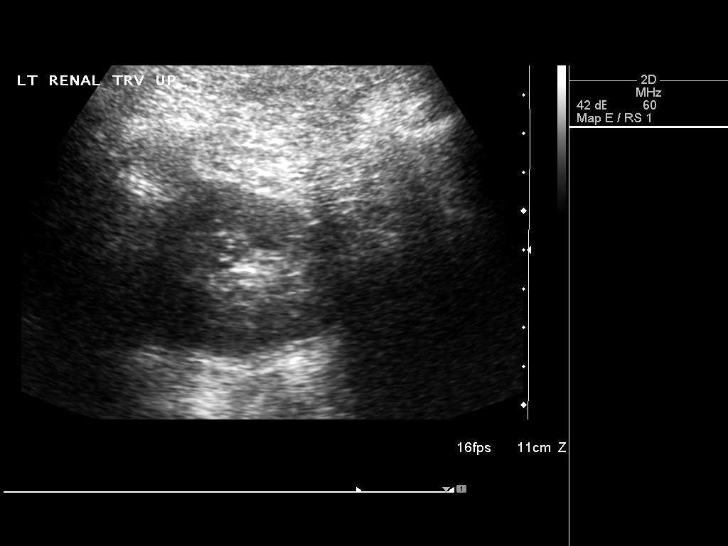
[im 84/84]
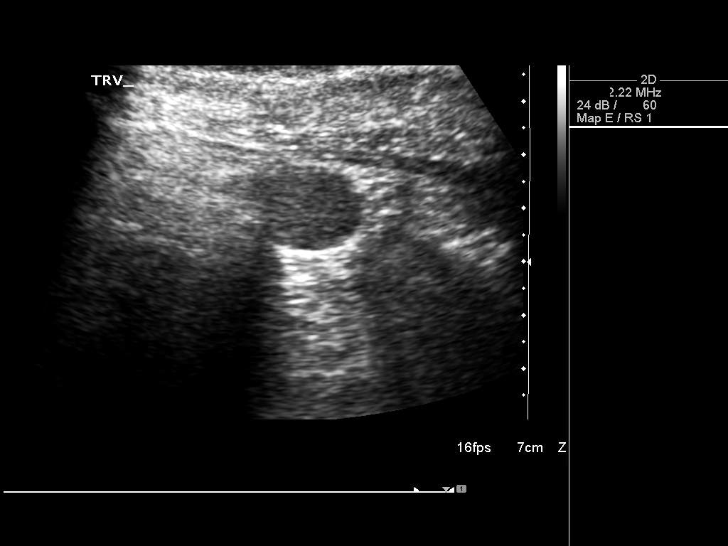

[14 of 25 positions shown; findings below may reference images not displayed]

FINDINGS: Gallbladder:

The gallbladder is visualized and no gallstones are noted. There is
no pain over the gallbladder with compression.

Common bile duct:

Diameter: The common bile duct is normal measuring 3 mm in diameter.

Liver:

The liver has a normal echogenic pattern. No focal abnormality is
seen.

IVC:

No abnormality visualized.

Pancreas:

The pancreas is well visualized with no abnormality noted.

Spleen:

A rounded soft tissue structure in the left upper quadrant is
consistent with splenic remnant or accessory spleen measuring 2.0 cm
in diameter.

Right Kidney:

Length: 10.4 cm..  No hydronephrosis is seen.

Left Kidney:

Length: 10.6 cm..  No hydronephrosis is noted.

Abdominal aorta:

The abdominal aorta is normal in caliber.

Other findings:

None.
IMPRESSION: Negative abdominal ultrasound other than probable accessory spleen
or splenic remnant in the left upper quadrant.
